# Patient Record
Sex: Female | Born: 1985 | Race: Black or African American | Hispanic: No | Marital: Single | Smoking: Current every day smoker
Health system: Southern US, Community
[De-identification: ages and names within clinical notes are randomized; demographics above are authoritative.]

---

## 2005-01-20 ENCOUNTER — Emergency Department: Payer: Self-pay | Admitting: Emergency Medicine

## 2007-05-07 ENCOUNTER — Ambulatory Visit: Payer: Self-pay | Admitting: General Surgery

## 2007-05-11 ENCOUNTER — Ambulatory Visit: Payer: Self-pay | Admitting: General Surgery

## 2008-03-01 ENCOUNTER — Ambulatory Visit: Payer: Self-pay

## 2008-10-02 ENCOUNTER — Observation Stay: Payer: Self-pay | Admitting: Obstetrics and Gynecology

## 2008-10-09 ENCOUNTER — Inpatient Hospital Stay: Payer: Self-pay

## 2011-04-21 ENCOUNTER — Emergency Department: Payer: Self-pay

## 2011-09-09 ENCOUNTER — Observation Stay: Payer: Self-pay | Admitting: Obstetrics and Gynecology

## 2011-09-09 LAB — URINALYSIS, COMPLETE
Bacteria: NONE SEEN
Glucose,UR: NEGATIVE mg/dL (ref 0–75)
Nitrite: NEGATIVE
Squamous Epithelial: 1

## 2011-11-11 ENCOUNTER — Inpatient Hospital Stay: Payer: Self-pay

## 2011-11-11 LAB — CBC WITH DIFFERENTIAL/PLATELET
Basophil %: 0.7 %
Eosinophil %: 1.2 %
HCT: 32.7 % — ABNORMAL LOW (ref 35.0–47.0)
Lymphocyte %: 11 %
MCH: 27.1 pg (ref 26.0–34.0)
MCHC: 32.3 g/dL (ref 32.0–36.0)
MCV: 84 fL (ref 80–100)
Monocyte #: 0.7 x10 3/mm (ref 0.2–0.9)
Monocyte %: 6.8 %
Neutrophil %: 80.3 %
Platelet: 104 10*3/uL — ABNORMAL LOW (ref 150–440)
RBC: 3.89 10*6/uL (ref 3.80–5.20)
WBC: 9.7 10*3/uL (ref 3.6–11.0)

## 2011-11-12 LAB — HEMATOCRIT: HCT: 32.3 % — ABNORMAL LOW (ref 35.0–47.0)

## 2014-07-05 ENCOUNTER — Ambulatory Visit: Payer: Self-pay

## 2014-08-14 ENCOUNTER — Inpatient Hospital Stay: Payer: Self-pay | Admitting: Obstetrics and Gynecology

## 2014-10-09 LAB — SURGICAL PATHOLOGY

## 2014-10-15 NOTE — Op Note (Signed)
PATIENT NAME:  Marcelino FreestoneJONES, Barbara David MR#:  [id number] DATE OF BIRTH:  09-06-1985  DATE OF PROCEDURE:  08/15/2014  PREOPERATIVE DIAGNOSIS: Desires sterility.   POSTOPERATIVE DIAGNOSIS: Desires sterility.   PROCEDURE: Bilateral tubal ligation through minilaparotomy with bilateral salpingectomies.   SURGEON: Christeen DouglasBethany Treniece Holsclaw, MD.    ESTIMATED BLOOD LOSS: 20 mL.   INTRAVENOUS FLUIDS: 700 mL.   SPECIMENS: Right and left tubes.   ANESTHESIA: General.   COMPLICATIONS: None.   INDICATION FOR PROCEDURE: [name] is a 29 year old gravida 9, para 4, who is postop day 1 from a normal spontaneous vaginal delivery and desires permanent sterility. Risks and benefits of this procedure were discussed with the patient and she knows and understands that it is a permanent sterilization.   PROCEDURE: The patient was taken to the operating room where she was identified by both name and birthdate. She was placed on the operating table in a supine position. General anesthesia was induced without difficulty. She was then prepped and draped abdominally in the usual sterile fashion. A formal timeout procedure was performed with all team members present and in agreement. Her uterine fundus was palpable about 4 cm above the umbilicus. Towel clamps were used to elevate the supraumbilical skin. A 4 cm skin incision was made sharply and carried down to the underlying fascia bluntly. The fascia was incised at the midline using Mayo scissors and the peritoneum was entered bluntly. The edges of the fascia were then tagged and left externally. The right fallopian tube was found and grasped with a Babcock clamp. The tube was carried out to its fimbriated end. The tube was then clamped across using a long Kelly and the tube was tied using 0 Vicryl suture x 2. The tube was excised using Metzenbaum scissors and passed off as a specimen. Examination of the adnexa revealed no bleeding.   Attention was turned to the left adnexa.   In a similar fashion the left tube was grasped, doubly clamped, and doubly tied. The tube was passed off as a specimen and examination of the left adnexa revealed no bleeding. 5 mL of 0.25% bupivacaine was placed in each adnexa in the peritoneal cavity.  The ovaries were normal. The uterus appeared normal. There were no adhesions. The omentum was viewed and also appeared normal.   The fascia was then closed in a running fashion using 0 Vicryl suture. Subcutaneous tissue was then gently reapproximated to decrease the risk of a subcutaneous fluid collection. The skin was closed in a running fashion subcuticularly using 4-0 Monocryl. A pressure dressing was placed. General anesthesia was reversed without difficulty and the patient tolerated all these procedures well.   She is now stable in recovery.    ____________________________ Cline CoolsBethany E. Bobby Ragan, MD beb:bu D: 08/15/2014 14:09:32 ET T: 08/15/2014 16:20:20 ET JOB#: [id number]  cc: Cline CoolsBethany E. Ilona Colley, MD, <Dictator> Cline CoolsBETHANY E Renel Ende MD ELECTRONICALLY SIGNED 08/25/2014 7:40

## 2014-10-24 NOTE — H&P (Signed)
L&D Evaluation:  History:   HPI 29 y/o [id number] @ 38+ wks [facility] early labor 5cm dilation BBOW sm show. Well pregnancy, HX thrombosed hemorrhoid (10/17/11) Anemia, Smoker few cigs per day, GBS +    Presents with contractions    Patient's Medical History No Chronic Illness    Patient's Surgical History none    Medications Pre Natal Vitamins    Allergies NKDA, latex sensitive    Social History tobacco  few cigs day    Family History Non-Contributory   ROS:   ROS All systems were reviewed.  HEENT, CNS, GI, GU, Respiratory, CV, Renal and Musculoskeletal systems were found to be normal.   Exam:   Vital Signs stable    Urine Protein not completed    General no apparent distress    Mental Status clear    Chest clear    Heart normal sinus rhythm    Abdomen gravid, non-tender    Estimated Fetal Weight Average for gestational age    Fetal Position vtx    Fundal Height term    Back no CVAT    Edema no edema    Reflexes 2+    Clonus negative    Pelvic 5cm 50% vx @ -1 BBOW nl show    Mebranes Intact    FHT normal rate with no decels, baseline 140's 150's avg variability with accels    Fetal Heart Rate 140    Ucx irregular, q 2/4 mins 60 sec moderate    Skin dry    Lymph no lymphadenopathy   Impression:   Impression early labor   Plan:   Plan antibiotics for GBBS prophylaxis    Comments Admitted, knows what to expect 4th baby. IV ABX begun. Plans epidural with labor progress. Family, partner supportive at bedside.   Electronic Signatures: Albertina ParrLugiano, Wandalene Abrams B (CNM)  (Signed 28-May-13 14:11)  Authored: L&D Evaluation   Last Updated: 28-May-13 14:11 by Albertina ParrLugiano, Rieley Khalsa B (CNM)

## 2014-10-24 NOTE — H&P (Signed)
L&D Evaluation:  History:  HPI 29 [id number] [facility] for scheduled IOL HX rapid labors. Few irregular mild contractions-advanced cervical dilation. Denies leaking  fluid or vaginal bleeding, baby is active. [facility], well [facility]. GBS negative.   Presents with IOL advanced cervical dilation   Patient's Medical History No Chronic Illness   Patient's Surgical History none   Medications Pre Natal Vitamins   Allergies NKDA   Social History none   Family History Non-Contributory   ROS:  ROS All systems were reviewed.  HEENT, CNS, GI, GU, Respiratory, CV, Renal and Musculoskeletal systems were found to be normal.   Exam:  Vital Signs stable   Urine Protein not completed   General no apparent distress   Mental Status clear   Chest clear   Heart normal sinus rhythm   Abdomen gravid, non-tender   Estimated Fetal Weight Average for gestational age   Fetal Position vtx   Back no CVAT   Edema no edema   Reflexes 2+   Clonus negative   Pelvic no external lesions, cx posterior 6cm 70% vtx @ -2 BOWI small show   Mebranes Intact   FHT normal rate with no decels, baseline 130's 140's avg variability with occ accels. FHR deceleration with position change down to 90's-100's x 45 seconds. EFM adjusted with prompt rise back to baseline   Fetal Heart Rate 136   Ucx irregular   Skin dry   Impression:  Impression early labor   Plan:  Plan monitor contractions and for cervical change   Comments Knows what to expect 5th baby. FOB at bedside, supportive. Plans epidural with labor progress.   Electronic Signatures: Albertina ParrLugiano, Xaiden Fleig B (CNM)  (Signed 29-Feb-16 09:06)  Authored: L&D Evaluation   Last Updated: 29-Feb-16 09:06 by Albertina ParrLugiano, Javien Tesch B (CNM)

## 2018-10-26 ENCOUNTER — Emergency Department
Admission: EM | Admit: 2018-10-26 | Discharge: 2018-10-26 | Disposition: A | Discharge status: Home / Self Care | Payer: 59 | Attending: Emergency Medicine | Admitting: Emergency Medicine

## 2018-10-26 ENCOUNTER — Encounter: Payer: Self-pay | Admitting: *Deleted

## 2018-10-26 ENCOUNTER — Emergency Department: Payer: 59

## 2018-10-26 ENCOUNTER — Other Ambulatory Visit: Payer: Self-pay

## 2018-10-26 DIAGNOSIS — F17200 Nicotine dependence, unspecified, uncomplicated: Secondary | ICD-10-CM | POA: Diagnosis not present

## 2018-10-26 DIAGNOSIS — M899 Disorder of bone, unspecified: Secondary | ICD-10-CM | POA: Diagnosis not present

## 2018-10-26 DIAGNOSIS — S8002XA Contusion of left knee, initial encounter: Secondary | ICD-10-CM

## 2018-10-26 DIAGNOSIS — Y9389 Activity, other specified: Secondary | ICD-10-CM | POA: Insufficient documentation

## 2018-10-26 DIAGNOSIS — S5002XA Contusion of left elbow, initial encounter: Secondary | ICD-10-CM | POA: Diagnosis not present

## 2018-10-26 DIAGNOSIS — M79632 Pain in left forearm: Secondary | ICD-10-CM | POA: Diagnosis not present

## 2018-10-26 DIAGNOSIS — Y999 Unspecified external cause status: Secondary | ICD-10-CM | POA: Insufficient documentation

## 2018-10-26 DIAGNOSIS — S4992XA Unspecified injury of left shoulder and upper arm, initial encounter: Secondary | ICD-10-CM | POA: Diagnosis present

## 2018-10-26 DIAGNOSIS — S40012A Contusion of left shoulder, initial encounter: Secondary | ICD-10-CM

## 2018-10-26 DIAGNOSIS — Y92410 Unspecified street and highway as the place of occurrence of the external cause: Secondary | ICD-10-CM | POA: Diagnosis not present

## 2018-10-26 MED ORDER — METHOCARBAMOL 500 MG PO TABS: 500.0000 mg | ORAL_TABLET | Freq: Four times a day (QID) | ORAL | 0 refills | Status: AC

## 2018-10-26 MED ORDER — MELOXICAM 15 MG PO TABS: 15.0000 mg | ORAL_TABLET | Freq: Every day | ORAL | 0 refills | Status: AC
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
## 2020-06-30 ENCOUNTER — Other Ambulatory Visit: Payer: 59

## 2020-06-30 DIAGNOSIS — Z20822 Contact with and (suspected) exposure to covid-19: Secondary | ICD-10-CM

## 2020-07-03 LAB — NOVEL CORONAVIRUS, NAA: SARS-CoV-2, NAA: NOT DETECTED

## 2020-08-07 ENCOUNTER — Other Ambulatory Visit: Payer: Self-pay

## 2020-08-07 ENCOUNTER — Emergency Department: Payer: 59

## 2020-08-07 ENCOUNTER — Emergency Department
Admission: EM | Admit: 2020-08-07 | Discharge: 2020-08-07 | Disposition: A | Discharge status: Home / Self Care | Payer: 59 | Attending: Emergency Medicine | Admitting: Emergency Medicine

## 2020-08-07 DIAGNOSIS — R110 Nausea: Secondary | ICD-10-CM | POA: Insufficient documentation

## 2020-08-07 DIAGNOSIS — R1032 Left lower quadrant pain: Secondary | ICD-10-CM | POA: Diagnosis present

## 2020-08-07 DIAGNOSIS — R109 Unspecified abdominal pain: Secondary | ICD-10-CM | POA: Diagnosis not present

## 2020-08-07 DIAGNOSIS — F17200 Nicotine dependence, unspecified, uncomplicated: Secondary | ICD-10-CM | POA: Insufficient documentation

## 2020-08-07 LAB — LIPASE, BLOOD: Lipase: 26 U/L (ref 11–51)

## 2020-08-07 LAB — TYPE AND SCREEN
ABO/RH(D): O NEG
Antibody Screen: NEGATIVE

## 2020-08-07 LAB — CHLAMYDIA/NGC RT PCR (ARMC ONLY)
Chlamydia Tr: NOT DETECTED
N gonorrhoeae: NOT DETECTED

## 2020-08-07 LAB — COMPREHENSIVE METABOLIC PANEL
ALT: 17 U/L (ref 0–44)
AST: 19 U/L (ref 15–41)
Albumin: 4.3 g/dL (ref 3.5–5.0)
Alkaline Phosphatase: 49 U/L (ref 38–126)
Anion gap: 9 (ref 5–15)
BUN: 8 mg/dL (ref 6–20)
CO2: 24 mmol/L (ref 22–32)
Calcium: 9.6 mg/dL (ref 8.9–10.3)
Chloride: 103 mmol/L (ref 98–111)
Creatinine, Ser: 0.85 mg/dL (ref 0.44–1.00)
GFR, Estimated: >60 mL/min (ref >60)
Glucose, Bld: 109 mg/dL — ABNORMAL HIGH (ref 70–99)
Potassium: 3.9 mmol/L (ref 3.5–5.1)
Sodium: 136 mmol/L (ref 135–145)
Total Bilirubin: 0.6 mg/dL (ref 0.3–1.2)
Total Protein: 8.6 g/dL — ABNORMAL HIGH (ref 6.5–8.1)

## 2020-08-07 LAB — URINALYSIS, COMPLETE (UACMP) WITH MICROSCOPIC
Bacteria, UA: NONE SEEN
Bilirubin Urine: NEGATIVE
Glucose, UA: NEGATIVE mg/dL
Hgb urine dipstick: NEGATIVE
Ketones, ur: NEGATIVE mg/dL
Leukocytes,Ua: NEGATIVE
Nitrite: NEGATIVE
Protein, ur: NEGATIVE mg/dL
Specific Gravity, Urine: 1.005 (ref 1.005–1.030)
pH: 7 (ref 5.0–8.0)

## 2020-08-07 LAB — WET PREP, GENITAL
Clue Cells Wet Prep HPF POC: NONE SEEN
Sperm: NONE SEEN
Trich, Wet Prep: NONE SEEN
Yeast Wet Prep HPF POC: NONE SEEN

## 2020-08-07 LAB — CBC
HCT: 29.7 % — ABNORMAL LOW (ref 36.0–46.0)
Hemoglobin: 8.6 g/dL — ABNORMAL LOW (ref 12.0–15.0)
MCH: 18.8 pg — ABNORMAL LOW (ref 26.0–34.0)
MCHC: 29 g/dL — ABNORMAL LOW (ref 30.0–36.0)
MCV: 64.8 fL — ABNORMAL LOW (ref 80.0–100.0)
Platelets: 364 10*3/uL (ref 150–400)
RBC: 4.58 MIL/uL (ref 3.87–5.11)
RDW: 21.3 % — ABNORMAL HIGH (ref 11.5–15.5)
WBC: 11.7 10*3/uL — ABNORMAL HIGH (ref 4.0–10.5)
nRBC: 0 % (ref 0.0–0.2)

## 2020-08-07 LAB — HCG, QUANTITATIVE, PREGNANCY: hCG, Beta Chain, Quant, S: <1 m[IU]/mL (ref <5)

## 2020-08-07 MED ORDER — HYDROCODONE-ACETAMINOPHEN 5-325 MG PO TABS: 1.0000 | ORAL_TABLET | Freq: Four times a day (QID) | ORAL | 0 refills | Status: AC | PRN

## 2020-08-07 MED ORDER — ONDANSETRON HCL 4 MG PO TABS: 4.0000 mg | ORAL_TABLET | Freq: Every day | ORAL | 0 refills | Status: AC | PRN

## 2020-08-07 MED ORDER — IRON 142 (45 FE) MG PO TBCR: 1.0000 | EXTENDED_RELEASE_TABLET | ORAL | 2 refills | Status: AC

## 2020-08-07 MED ORDER — IOHEXOL 300 MG/ML  SOLN
100.0000 mL | Freq: Once | INTRAMUSCULAR | Status: AC | PRN
  Administered 2020-08-07: 100 mL via INTRAVENOUS
  Filled 2020-08-07: qty 100

## 2020-08-07 MED ORDER — KETOROLAC TROMETHAMINE 30 MG/ML IJ SOLN
15.0000 mg | Freq: Once | INTRAMUSCULAR | Status: AC
  Administered 2020-08-07: 15 mg via INTRAVENOUS
  Filled 2020-08-07: qty 1

## 2020-08-07 MED ORDER — HYDROCODONE-ACETAMINOPHEN 5-325 MG PO TABS
1.0000 | ORAL_TABLET | Freq: Once | ORAL | Status: AC
  Administered 2020-08-07: 1 via ORAL
  Filled 2020-08-07: qty 1

## 2020-08-07 MED ORDER — ONDANSETRON HCL 4 MG/2ML IJ SOLN
4.0000 mg | INTRAMUSCULAR | Status: AC
  Administered 2020-08-07: 4 mg via INTRAVENOUS
  Filled 2020-08-07: qty 2

## 2020-08-07 MED ORDER — MORPHINE SULFATE (PF) 4 MG/ML IV SOLN
4.0000 mg | Freq: Once | INTRAVENOUS | Status: AC
  Administered 2020-08-07: 4 mg via INTRAVENOUS
  Filled 2020-08-07: qty 1

## 2020-08-07 NOTE — ED Provider Notes (Addendum)
With regards the patient's low hemoglobin, I would note that she has what appears to be microcytic anemia.  Additionally previous hematocrits are around 32, these were 8 years ago though.  She denies any known bleeding issues no black or bloody stools.  I do not think the patient is suffering from acute hemorrhage, but rather likely an ongoing anemia and I discussed this with her and also her mother at the bedside recommending she obtain follow-up with her primary care for further evaluation of this which she is agreeable with.  Prior hemoglobin of 9.3 in the [facility] system from 2016.   Sharyn Creamer, MD 08/07/20 3220    Sharyn Creamer, MD 08/07/20 2044

## 2020-08-07 NOTE — ED Notes (Signed)
This RN at bedside with Littie Deeds MD to assist with pelvic examination and swab collection.

## 2020-08-07 NOTE — ED Triage Notes (Signed)
Pt to ED for chief complaint of mid left abdominal pain that started yesterday. Reports worsening with movement.  +nausea Denies diarrhea, vomiting

## 2020-08-07 NOTE — ED Provider Notes (Signed)
[facility] Emergency Department Provider Note ____________________________________________   Event Date/Time   First MD Initiated Contact with Patient 08/07/20 1806     (approximate)  I have reviewed the triage vital signs and the nursing notes.   HISTORY  Chief Complaint Abdominal Pain    HPI OBDULIA STEIER is a 35 y.o. female reports no significant past medical history except for having 5 living children, previous tubal ligation and thinks she may have had a cyst years and years ago and had some anemia   Yesterday began having pain in her left lower abdomen and left pelvis.  His pain is steadily worsened and significantly worsened by any movement.  She is having severe excruciating pain located in her left flank and left lower abdomen now.  No fevers or chills.  Slight nausea but no vomiting.  No change in bowel habits  No chest pain or trouble breathing  No vaginal discharge or vaginal bleeding.  Pain is very sharp and worsens significantly with any movement.  Improves with rest  Denies pregnancy, reports previous tubal ligation  History reviewed. No pertinent past medical history.  There are no problems to display for this patient.   History reviewed. No pertinent surgical history.  Prior to Admission medications   Medication Sig Start Date End Date Taking? Authorizing Provider  meloxicam (MOBIC) 15 MG tablet Take 1 tablet (15 mg total) by mouth daily. 10/26/18   Cuthriell, Delorise Royals, PA-C  methocarbamol (ROBAXIN) 500 MG tablet Take 1 tablet (500 mg total) by mouth 4 (four) times daily. 10/26/18   Cuthriell, Delorise Royals, PA-C    Allergies Patient has no known allergies.  History reviewed. No pertinent family history.  Social History Social History   Tobacco Use  . Smoking status: Current Every Day Smoker  . Smokeless tobacco: Never Used  Substance Use Topics  . Alcohol use: Yes  . Drug use: Not Currently    Review of  Systems Constitutional: No fever/chills Eyes: No visual changes. ENT: No sore throat. Cardiovascular: Denies chest pain. Respiratory: Denies shortness of breath. Gastrointestinal: See HPI Genitourinary: Negative for dysuria. Musculoskeletal: Negative for back pain. Skin: Negative for rash. Neurological: Negative for headaches or weakness.    ____________________________________________   PHYSICAL EXAM:  VITAL SIGNS: ED Triage Vitals [08/07/20 1526]  Enc Vitals Group     BP (!) 153/108     Pulse Rate (!) 113     Resp 18     Temp 98 F (36.7 C)     Temp Source Oral     SpO2 100 %     Weight 223 lb (101.2 kg)     Height 5\' 9"  (1.753 m)     Head Circumference      Peak Flow      Pain Score 9     Pain Loc      Pain Edu?      Excl. in GC?     Constitutional: Alert and oriented.  Patient appears in painful distress, peers tearful reports hearing due to severity of pain in her left lower pelvis left flank Eyes: Conjunctivae are normal. Head: Atraumatic. Nose: No congestion/rhinnorhea. Mouth/Throat: Mucous membranes are moist. Neck: No stridor.  Cardiovascular: Normal rate, regular rhythm. Grossly normal heart sounds.  Good peripheral circulation. Respiratory: Normal respiratory effort.  No retractions. Lungs CTAB. Gastrointestinal: Soft and mild tenderness reproducible in the left flank left lower quadrant but no significant rebound or guarding.  Her pain normal seems slightly out  of proportion to examination and is poorly elicited by exam.  No CVA tenderness bilateral. Genitourinary: Normal external exam.  Very scant slight amount of mucus but does not appear obviously purulent coming from the os, no foul odor.  Patient denies any pain or discomfort to palpation of adnexa except for some slight discomfort left adnexa reports to be minimal.  Also no cervical motion tenderness.  Escorted by RN throughout Musculoskeletal: No lower extremity tenderness nor edema. Neurologic:   Normal speech and language. No gross focal neurologic deficits are appreciated.  Skin:  Skin is warm, dry and intact. No rash noted. Psychiatric: Mood and affect are normal. Speech and behavior are normal.  ____________________________________________   LABS (all labs ordered are listed, but only abnormal results are displayed)  Labs Reviewed  COMPREHENSIVE METABOLIC PANEL - Abnormal; Notable for the following components:      Result Value   Glucose, Bld 109 (*)    Total Protein 8.6 (*)    All other components within normal limits  CBC - Abnormal; Notable for the following components:   WBC 11.7 (*)    Hemoglobin 8.6 (*)    HCT 29.7 (*)    MCV 64.8 (*)    MCH 18.8 (*)    MCHC 29.0 (*)    RDW 21.3 (*)    All other components within normal limits  URINALYSIS, COMPLETE (UACMP) WITH MICROSCOPIC - Abnormal; Notable for the following components:   Color, Urine STRAW (*)    APPearance CLEAR (*)    All other components within normal limits  WET PREP, GENITAL  CHLAMYDIA/NGC RT PCR ([facility] ONLY)  LIPASE, BLOOD  HCG, QUANTITATIVE, PREGNANCY  TYPE AND SCREEN   ____________________________________________  EKG  ED ECG REPORT I, Sharyn Creamer, the attending physician, personally viewed and interpreted this ECG.  Date: 08/07/2020 EKG Time: 1520 Rate: 110 Rhythm: Sinus tachycardia at 110 QRS Axis: normal Intervals: normal ST/T Wave abnormalities: normal Narrative Interpretation: no evidence of acute ischemia  ____________________________________________  RADIOLOGY  CT ABDOMEN PELVIS W CONTRAST  Result Date: 08/07/2020 CLINICAL DATA:  Flank pain, suspected kidney stone EXAM: CT ABDOMEN AND PELVIS WITH CONTRAST TECHNIQUE: Multidetector CT imaging of the abdomen and pelvis was performed using the standard protocol following bolus administration of intravenous contrast. CONTRAST:  OMNIPAQUE IOHEXOL 300 MG/ML  SOLN COMPARISON:  None aside from ultrasound of the same date.  FINDINGS: Lower chest: Incidental imaging of the lung bases is unremarkable. Hepatobiliary: Insert normal hepatic biliary Pancreas: Normal, without mass, inflammation or ductal dilatation. Spleen: Normal appearance of the spleen. No focal lesion with normal size. Adrenals/Urinary Tract: Adrenal glands are normal. Smooth contour of the urinary bladder. Symmetric renal enhancement. No hydronephrosis. No suspicious renal lesion. Stomach/Bowel: Stomach under distended limiting assessment. Small hiatal hernia. Normal caliber small bowel. No sign of acute gastrointestinal abnormality with normal appendix. RIGHT-sided colonic diverticulosis. Vascular/Lymphatic: Patent abdominal vessels. There is no gastrohepatic or hepatoduodenal ligament lymphadenopathy. No retroperitoneal or mesenteric lymphadenopathy. No pelvic sidewall lymphadenopathy. Reproductive: Normal appearance of the uterus and adnexa. Small enhancing foci in the uterus likely small leiomyomata. Other: No ascites.  No free air. Musculoskeletal: No acute musculoskeletal process. No destructive bone finding. IMPRESSION: 1. No acute findings in the abdomen or pelvis. 2. Small hiatal hernia. 3. RIGHT-sided colonic diverticulosis without evidence of acute gastrointestinal abnormality. Electronically Signed   By: Donzetta Kohut M.D.   On: 08/07/2020 20:03    CT imaging reviewed, negative for acute findings. ____________________________________________   PROCEDURES  Procedure(s) performed:  None  Procedures  Critical Care performed: No  ____________________________________________   INITIAL IMPRESSION / ASSESSMENT AND PLAN / ED COURSE  Pertinent labs & imaging results that were available during my care of the patient were reviewed by me and considered in my medical decision making (see chart for details).   Differential diagnosis includes but is not limited to, abdominal perforation, aortic dissection, cholecystitis, appendicitis, diverticulitis,  colitis, esophagitis/gastritis, kidney stone, pyelonephritis, urinary tract infection, aortic aneurysm. All are considered in decision and treatment plan. Based upon the patient's presentation and risk factors, and the patient's age I am suspicious for an acute peritoneal type of pathology or some type of musculoskeletal pathology that is best elicited by movement.  She rests more comfortably when still, also noted the patient has anemia but reports of baseline anemia and does not think she has had any blood work for about 6 years.  Difficult to tell if this is acute, though her MCV of 65 argues this may be not acute.  We will proceed with CT imaging as well as ultrasound to further evaluate.     ----------------------------------------- 8:41 PM on 08/07/2020 -----------------------------------------  Patient's pain is improving, still having some.  Will provide hydrocodone.  She reports use of that in the distant past with good effect.  Discussed with the patient safe use of hydrocodone if discharged, also discussed with Dr. Roxan Hockey will be following up with her once additional results return.  Patient comfortable with this plan.  Does feel improved from initial presentation  Ongoing care signed Dr. Roxan Hockey, follow-up on transvaginal ultrasound results as well as wet prep and GC.  Following treat as appropriate, anticipate likely be able to discharge home and I will provide prescription for the patient for hydrocodone for which I have discussed risks benefits and recommendation not to drive while taking this medication or use more than prescribed.    ____________________________________________   FINAL CLINICAL IMPRESSION(S) / ED DIAGNOSES  Final diagnoses:  Left flank pain        Note:  This document was prepared using Dragon voice recognition software and may include unintentional dictation errors       Sharyn Creamer, MD 08/07/20 2042

## 2020-08-07 NOTE — ED Provider Notes (Signed)
Patient received in signout from Dr. Fanny Bien.  [place] reassuring.  Discussed results and follow up with OBgyn.  Patient agreeable to plan.   Willy Eddy, MD 08/07/20 2120

## 2020-08-07 NOTE — Discharge Instructions (Signed)
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
## 2022-07-08 IMAGING — US US PELVIS COMPLETE
1 series · 13 of 25 positions shown · non-contrast
Comparison: CT abdomen pelvis from same day.

CLINICAL DATA: Left flank and pelvic pain. History of prior tubal
ligation.

EXAM:
TRANSABDOMINAL AND TRANSVAGINAL ULTRASOUND OF PELVIS
DOPPLER ULTRASOUND OF OVARIES
TECHNIQUE: Both transabdominal and transvaginal ultrasound examinations of the
pelvis were performed. Transabdominal technique was performed for
global imaging of the pelvis including uterus, ovaries, adnexal
regions, and pelvic cul-de-sac.
It was necessary to proceed with endovaginal exam following the
transabdominal exam to visualize the endometrium. Color and duplex
Doppler ultrasound was utilized to evaluate blood flow to the
ovaries.

[Series 1: us pelvis (transabdominal only) · 13 of 78 slices shown]
[im 1/78]
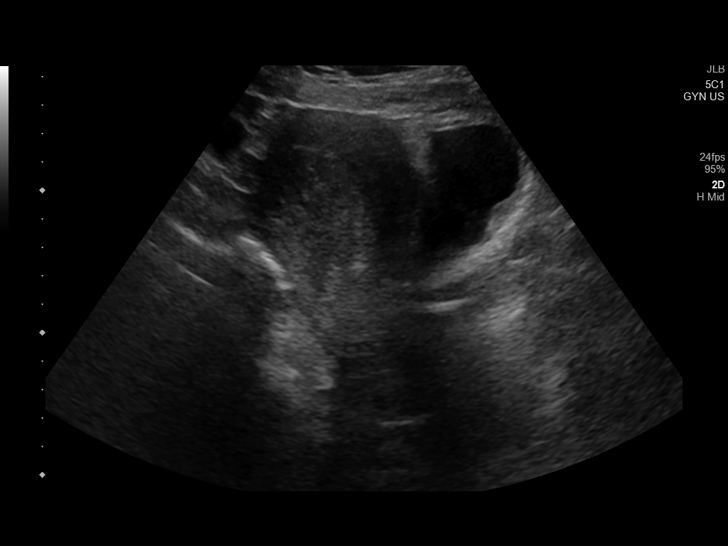
[im 7/78]
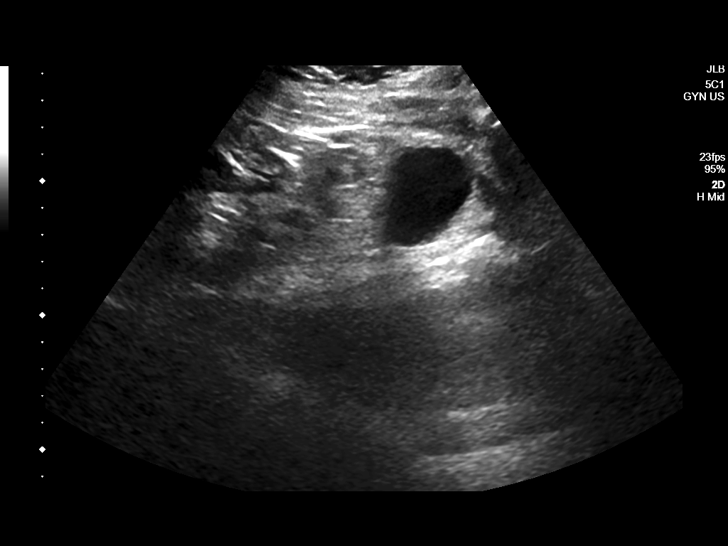
[im 13/78]
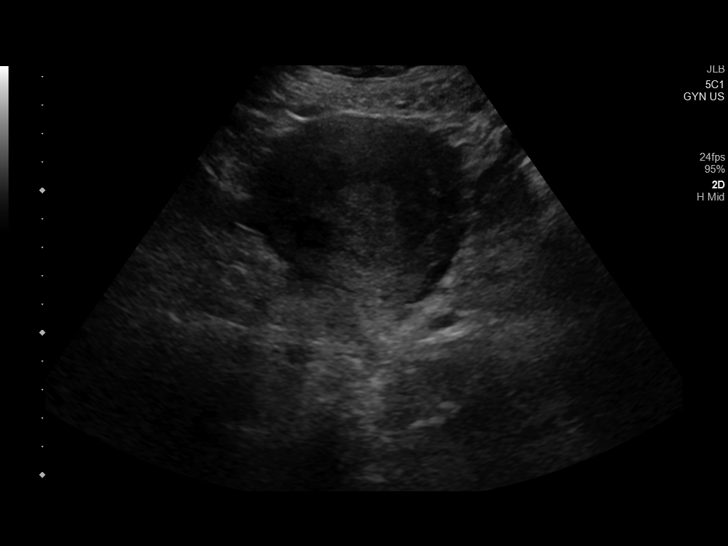
[im 20/78]
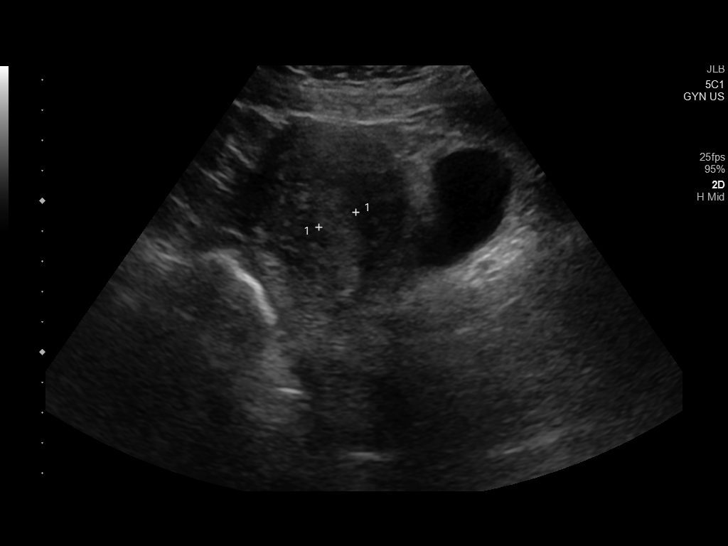
[im 26/78]
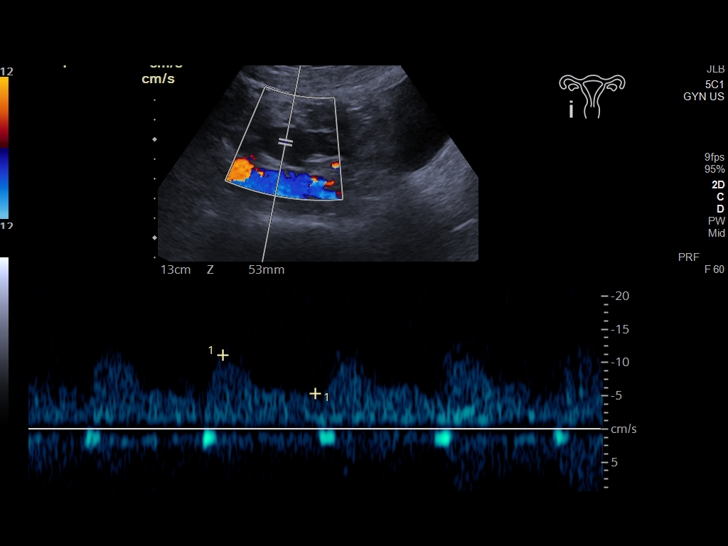
[im 33/78]
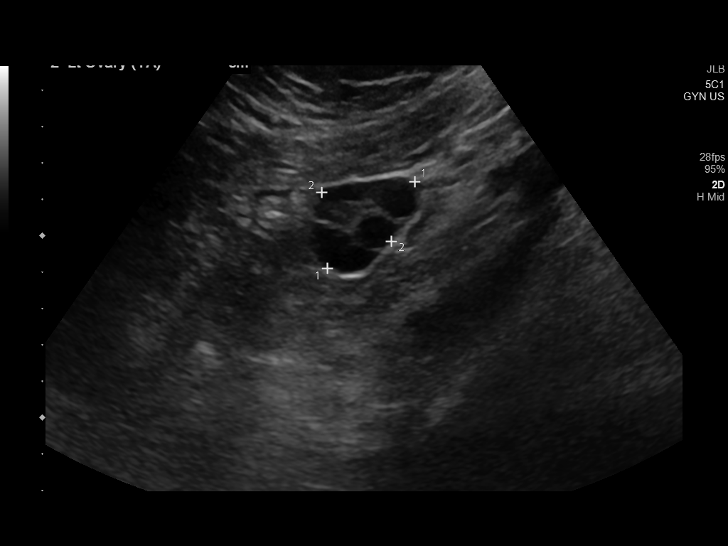
[im 39/78]
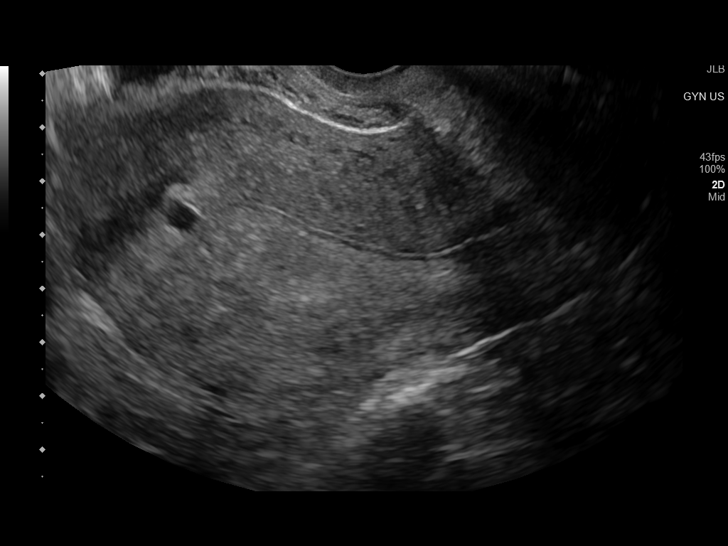
[im 45/78]
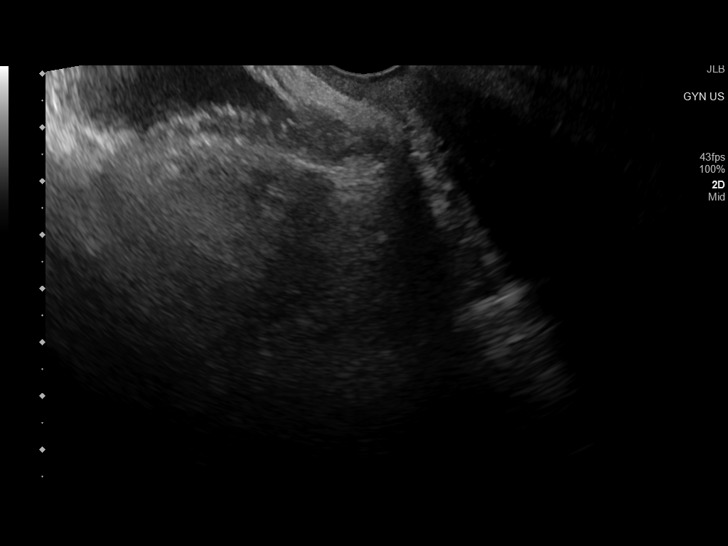
[im 52/78]
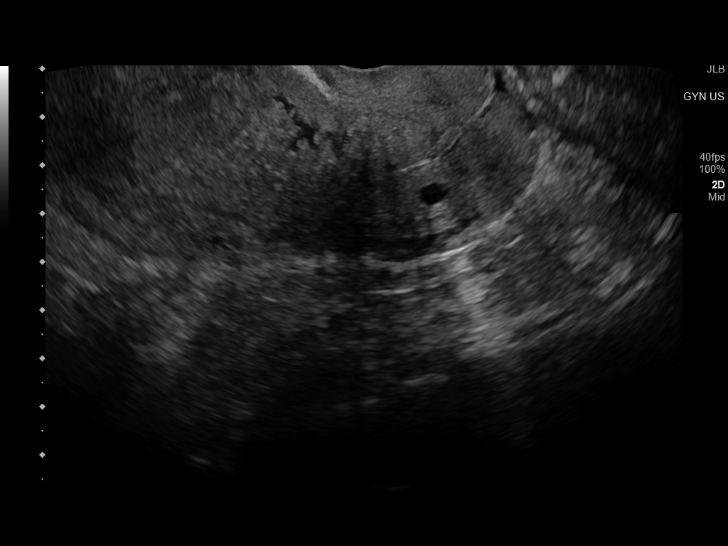
[im 58/78]
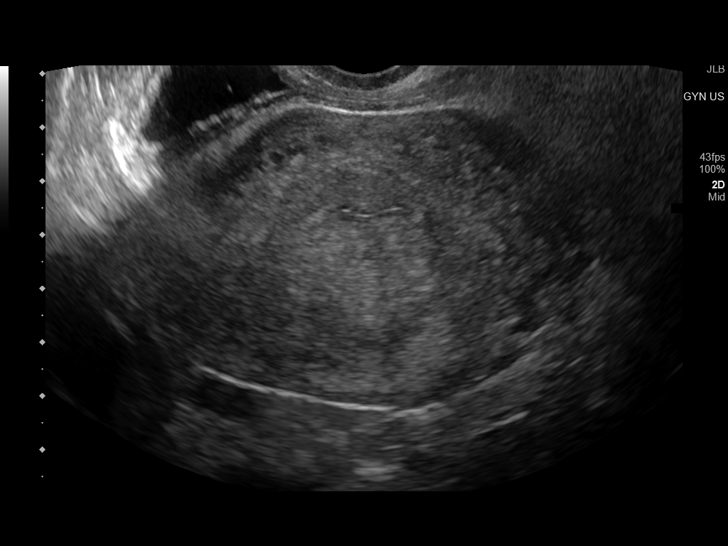
[im 65/78]
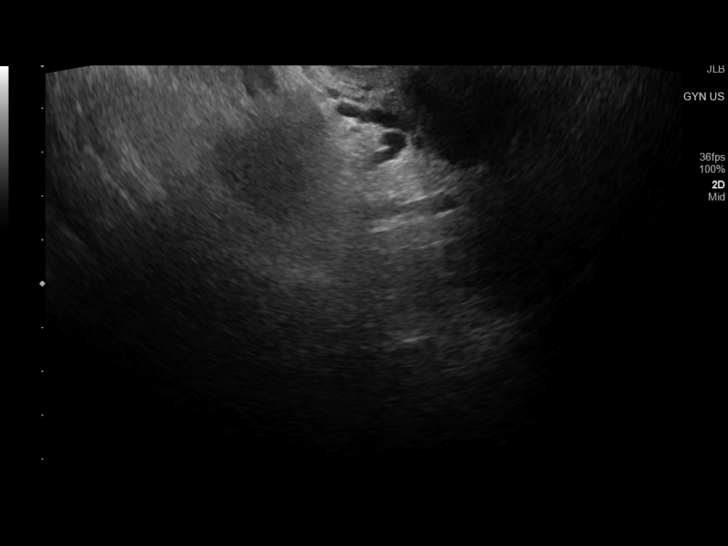
[im 71/78]
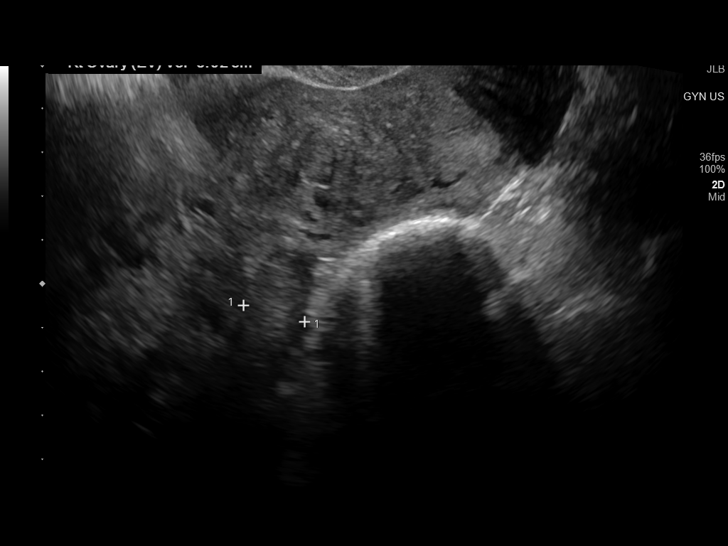
[im 78/78]
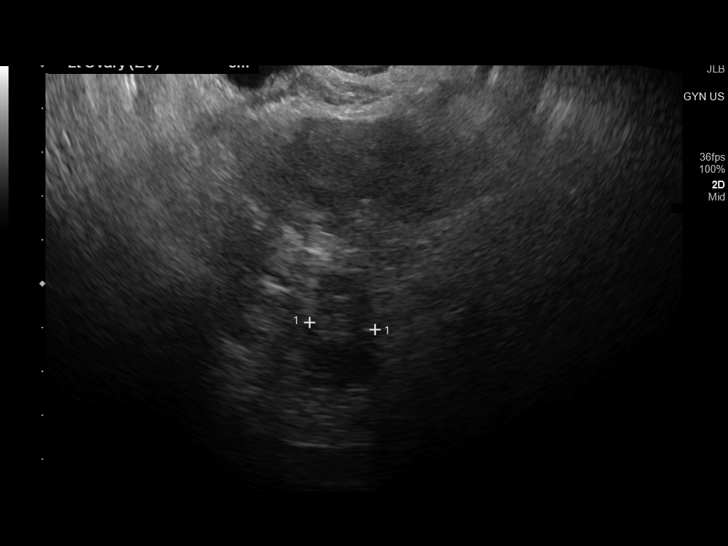

[13 of 25 positions shown; findings below may reference images not displayed]

FINDINGS: Uterus

Measurements: 11.5 x 5.8 x 7.8 cm = volume: 273 mL. 1.5 x 1.3 x
cm intramural fibroid in the left uterine body.

Endometrium

Thickness: 3 mm.  6 mm subendometrial cyst at the fundus.

Right ovary

Measurements: 3.7 x 1.9 x 2.5 cm = volume: 8.8 mL. Normal
appearance/no adnexal mass.

Left ovary

Measurements: 3.4 x 2.3 x 2.3 cm = volume: 9.7 S mL. Normal
appearance/no adnexal mass.

Pulsed Doppler evaluation of both ovaries demonstrates normal
low-resistance arterial and venous waveforms.

Other findings

No abnormal free fluid.
IMPRESSION: 1. No acute abnormality.
2. Small intramural uterine fibroid.  Cysts
